# Patient Record
Sex: Male | Born: 1955
Health system: Southern US, Community
[De-identification: ages and names within clinical notes are randomized; demographics above are authoritative.]

## PROBLEM LIST (undated history)

## (undated) DIAGNOSIS — J439 Emphysema, unspecified: Secondary | ICD-10-CM

## (undated) DIAGNOSIS — J984 Other disorders of lung: Secondary | ICD-10-CM

## (undated) DIAGNOSIS — J479 Bronchiectasis, uncomplicated: Secondary | ICD-10-CM

## (undated) DIAGNOSIS — R0602 Shortness of breath: Secondary | ICD-10-CM

## (undated) DIAGNOSIS — E7800 Pure hypercholesterolemia, unspecified: Secondary | ICD-10-CM

## (undated) HISTORY — DX: Emphysema, unspecified: J43.9

## (undated) HISTORY — DX: Pure hypercholesterolemia, unspecified: E78.00

## (undated) HISTORY — DX: Other disorders of lung: J98.4

## (undated) HISTORY — DX: Bronchiectasis, uncomplicated: J47.9

## (undated) HISTORY — DX: Shortness of breath: R06.02

---

## 2019-05-22 DIAGNOSIS — R509 Fever, unspecified: Secondary | ICD-10-CM | POA: Diagnosis not present

## 2019-05-22 DIAGNOSIS — U071 COVID-19: Secondary | ICD-10-CM | POA: Diagnosis not present

## 2019-05-22 DIAGNOSIS — M5430 Sciatica, unspecified side: Secondary | ICD-10-CM | POA: Diagnosis not present

## 2019-05-25 DIAGNOSIS — Z9981 Dependence on supplemental oxygen: Secondary | ICD-10-CM | POA: Diagnosis not present

## 2019-05-25 DIAGNOSIS — R0602 Shortness of breath: Secondary | ICD-10-CM | POA: Diagnosis not present

## 2019-05-25 DIAGNOSIS — R6520 Severe sepsis without septic shock: Secondary | ICD-10-CM | POA: Diagnosis not present

## 2019-05-25 DIAGNOSIS — I252 Old myocardial infarction: Secondary | ICD-10-CM | POA: Diagnosis not present

## 2019-05-25 DIAGNOSIS — J1289 Other viral pneumonia: Secondary | ICD-10-CM | POA: Diagnosis not present

## 2019-05-25 DIAGNOSIS — A419 Sepsis, unspecified organism: Secondary | ICD-10-CM | POA: Diagnosis not present

## 2019-05-25 DIAGNOSIS — R000 Tachycardia, unspecified: Secondary | ICD-10-CM | POA: Diagnosis not present

## 2019-05-25 DIAGNOSIS — Z8673 Personal history of transient ischemic attack (TIA), and cerebral infarction without residual deficits: Secondary | ICD-10-CM | POA: Diagnosis not present

## 2019-05-25 DIAGNOSIS — T380X5A Adverse effect of glucocorticoids and synthetic analogues, initial encounter: Secondary | ICD-10-CM | POA: Diagnosis not present

## 2019-05-25 DIAGNOSIS — A4189 Other specified sepsis: Secondary | ICD-10-CM | POA: Diagnosis not present

## 2019-05-25 DIAGNOSIS — U071 COVID-19: Secondary | ICD-10-CM | POA: Diagnosis not present

## 2019-05-25 DIAGNOSIS — R739 Hyperglycemia, unspecified: Secondary | ICD-10-CM | POA: Diagnosis not present

## 2019-05-25 DIAGNOSIS — J9601 Acute respiratory failure with hypoxia: Secondary | ICD-10-CM | POA: Diagnosis not present

## 2019-06-10 DIAGNOSIS — U071 COVID-19: Secondary | ICD-10-CM | POA: Diagnosis not present

## 2019-06-30 DIAGNOSIS — U071 COVID-19: Secondary | ICD-10-CM | POA: Diagnosis not present

## 2019-07-24 DIAGNOSIS — M5441 Lumbago with sciatica, right side: Secondary | ICD-10-CM | POA: Diagnosis not present

## 2019-07-24 DIAGNOSIS — Z131 Encounter for screening for diabetes mellitus: Secondary | ICD-10-CM | POA: Diagnosis not present

## 2019-07-24 DIAGNOSIS — M5442 Lumbago with sciatica, left side: Secondary | ICD-10-CM | POA: Diagnosis not present

## 2019-07-24 DIAGNOSIS — Z03818 Encounter for observation for suspected exposure to other biological agents ruled out: Secondary | ICD-10-CM | POA: Diagnosis not present

## 2019-07-24 DIAGNOSIS — Z79899 Other long term (current) drug therapy: Secondary | ICD-10-CM | POA: Diagnosis not present

## 2019-07-24 DIAGNOSIS — R5383 Other fatigue: Secondary | ICD-10-CM | POA: Diagnosis not present

## 2019-07-24 DIAGNOSIS — E559 Vitamin D deficiency, unspecified: Secondary | ICD-10-CM | POA: Diagnosis not present

## 2019-07-24 DIAGNOSIS — Z125 Encounter for screening for malignant neoplasm of prostate: Secondary | ICD-10-CM | POA: Diagnosis not present

## 2019-07-24 DIAGNOSIS — Z0000 Encounter for general adult medical examination without abnormal findings: Secondary | ICD-10-CM | POA: Diagnosis not present

## 2019-07-24 DIAGNOSIS — R0602 Shortness of breath: Secondary | ICD-10-CM | POA: Diagnosis not present

## 2019-07-24 DIAGNOSIS — R072 Precordial pain: Secondary | ICD-10-CM | POA: Diagnosis not present

## 2019-07-24 DIAGNOSIS — G8929 Other chronic pain: Secondary | ICD-10-CM | POA: Diagnosis not present

## 2019-07-31 DIAGNOSIS — U071 COVID-19: Secondary | ICD-10-CM | POA: Diagnosis not present

## 2019-08-28 DIAGNOSIS — U071 COVID-19: Secondary | ICD-10-CM | POA: Diagnosis not present

## 2019-09-28 DIAGNOSIS — U071 COVID-19: Secondary | ICD-10-CM | POA: Diagnosis not present

## 2019-12-10 DIAGNOSIS — K219 Gastro-esophageal reflux disease without esophagitis: Secondary | ICD-10-CM | POA: Diagnosis not present

## 2019-12-10 DIAGNOSIS — I252 Old myocardial infarction: Secondary | ICD-10-CM | POA: Diagnosis not present

## 2019-12-10 DIAGNOSIS — M5432 Sciatica, left side: Secondary | ICD-10-CM | POA: Diagnosis not present

## 2019-12-10 DIAGNOSIS — Z7689 Persons encountering health services in other specified circumstances: Secondary | ICD-10-CM | POA: Diagnosis not present

## 2020-03-16 DIAGNOSIS — M545 Low back pain, unspecified: Secondary | ICD-10-CM | POA: Diagnosis not present

## 2020-03-16 DIAGNOSIS — M79604 Pain in right leg: Secondary | ICD-10-CM | POA: Diagnosis not present

## 2020-03-16 DIAGNOSIS — Z87891 Personal history of nicotine dependence: Secondary | ICD-10-CM | POA: Diagnosis not present

## 2020-03-23 DIAGNOSIS — M79604 Pain in right leg: Secondary | ICD-10-CM | POA: Diagnosis not present

## 2020-04-21 DIAGNOSIS — Z87891 Personal history of nicotine dependence: Secondary | ICD-10-CM | POA: Diagnosis not present

## 2020-04-21 DIAGNOSIS — R0602 Shortness of breath: Secondary | ICD-10-CM | POA: Diagnosis not present

## 2020-04-21 DIAGNOSIS — R062 Wheezing: Secondary | ICD-10-CM | POA: Diagnosis not present

## 2020-06-09 DIAGNOSIS — R062 Wheezing: Secondary | ICD-10-CM | POA: Diagnosis not present

## 2020-06-09 DIAGNOSIS — M545 Low back pain, unspecified: Secondary | ICD-10-CM | POA: Diagnosis not present

## 2020-06-23 DIAGNOSIS — Z20822 Contact with and (suspected) exposure to covid-19: Secondary | ICD-10-CM | POA: Diagnosis not present

## 2020-06-23 DIAGNOSIS — R0602 Shortness of breath: Secondary | ICD-10-CM | POA: Diagnosis not present

## 2020-06-23 DIAGNOSIS — R42 Dizziness and giddiness: Secondary | ICD-10-CM | POA: Diagnosis not present

## 2020-06-23 DIAGNOSIS — J101 Influenza due to other identified influenza virus with other respiratory manifestations: Secondary | ICD-10-CM | POA: Diagnosis not present

## 2020-06-26 DIAGNOSIS — I4510 Unspecified right bundle-branch block: Secondary | ICD-10-CM | POA: Diagnosis not present

## 2020-07-05 DIAGNOSIS — R000 Tachycardia, unspecified: Secondary | ICD-10-CM | POA: Diagnosis not present

## 2020-07-05 DIAGNOSIS — R0602 Shortness of breath: Secondary | ICD-10-CM | POA: Diagnosis not present

## 2020-07-05 DIAGNOSIS — R062 Wheezing: Secondary | ICD-10-CM | POA: Diagnosis not present

## 2020-07-05 DIAGNOSIS — R0603 Acute respiratory distress: Secondary | ICD-10-CM | POA: Diagnosis not present

## 2020-07-05 DIAGNOSIS — J479 Bronchiectasis, uncomplicated: Secondary | ICD-10-CM | POA: Diagnosis not present

## 2020-07-05 DIAGNOSIS — Z8616 Personal history of COVID-19: Secondary | ICD-10-CM | POA: Diagnosis not present

## 2020-07-05 DIAGNOSIS — Z20822 Contact with and (suspected) exposure to covid-19: Secondary | ICD-10-CM | POA: Diagnosis not present

## 2020-07-05 DIAGNOSIS — I4519 Other right bundle-branch block: Secondary | ICD-10-CM | POA: Diagnosis not present

## 2020-07-06 ENCOUNTER — Encounter: Payer: Self-pay | Admitting: Pulmonary Disease

## 2020-07-06 ENCOUNTER — Ambulatory Visit: Payer: BC Managed Care – PPO | Admitting: Pulmonary Disease

## 2020-07-06 ENCOUNTER — Other Ambulatory Visit: Payer: Self-pay

## 2020-07-06 VITALS — BP 130/70 | HR 123 | Temp 98.2°F | Ht 70.0 in | Wt 243.4 lb

## 2020-07-06 DIAGNOSIS — R000 Tachycardia, unspecified: Secondary | ICD-10-CM | POA: Diagnosis not present

## 2020-07-06 DIAGNOSIS — J432 Centrilobular emphysema: Secondary | ICD-10-CM

## 2020-07-06 DIAGNOSIS — J449 Chronic obstructive pulmonary disease, unspecified: Secondary | ICD-10-CM | POA: Diagnosis not present

## 2020-07-06 DIAGNOSIS — R9389 Abnormal findings on diagnostic imaging of other specified body structures: Secondary | ICD-10-CM

## 2020-07-06 DIAGNOSIS — J471 Bronchiectasis with (acute) exacerbation: Secondary | ICD-10-CM

## 2020-07-06 DIAGNOSIS — I4510 Unspecified right bundle-branch block: Secondary | ICD-10-CM | POA: Diagnosis not present

## 2020-07-06 MED ORDER — TRELEGY ELLIPTA 200-62.5-25 MCG/INH IN AEPB: 1.0000 | INHALATION_SPRAY | Freq: Every day | RESPIRATORY_TRACT | 2 refills | Status: DC

## 2020-07-06 MED ORDER — TRELEGY ELLIPTA 200-62.5-25 MCG/INH IN AEPB: 1.0000 | INHALATION_SPRAY | Freq: Every day | RESPIRATORY_TRACT | 0 refills | Status: DC

## 2020-07-06 MED ORDER — PREDNISONE 20 MG PO TABS: 20.0000 mg | ORAL_TABLET | Freq: Every day | ORAL | 0 refills | Status: DC

## 2020-07-06 MED ORDER — LEVOFLOXACIN 750 MG PO TABS: 750.0000 mg | ORAL_TABLET | Freq: Every day | ORAL | 0 refills | Status: AC

## 2020-07-06 NOTE — Patient Instructions (Signed)
Shortness of breath Emphysema Bronchiectasis  Called in a prescription for Levaquin Start you on Trelegy -We will give you a sample of Trelegy, prescription will be sent to pharmacy  Sent in a prescription for steroids to have on hand, 20 mg daily, you may start this to be used for 2 to 3 days if you feel you are not started as what happened on the day you went to the hospital  We will get a breathing study at some point when things are more steady to assess lung functions  I will see you in about 6 to 8 weeks  Call with significant concerns  If you can, currently obtain a hard copy of your CT scan to be made available so I can review it   Call with significant concerns

## 2020-07-06 NOTE — Progress Notes (Signed)
Neil Martin    [id number]    Oct 11, 1955  Primary Care Physician:Pcp, No  Referring Physician: No referring provider defined for this encounter.  Chief complaint:   Patient being seen for shortness of breath  HPI:  Shortness of breath has been progressive and led to evaluation in the emergency department yesterday with him not being able to breathe Signe Colt He did have a CT scan of the chest showing bronchiectasis, emphysema  He did have Covid December 2020 Felt he recovered to a good degree but still had some shortness of breath and cough  He will have spasms of coughing usually at night with amlodipine able to take in a breath at all Has been using albuterol as needed Was treated for a bronchitis about 6 weeks ago with a course of amoxicillin  Following discharge yesterday, he was given some steroids  Reformed smoker quit in 2014  History of hypercholesterolemia  Outpatient Encounter Medications as of 07/06/2020  Medication Sig  . albuterol (VENTOLIN HFA) 108 (90 Base) MCG/ACT inhaler Inhale into the lungs.  . benzonatate (TESSALON) 100 MG capsule Take 200 mg by mouth every 8 (eight) hours as needed.  . predniSONE (DELTASONE) 20 MG tablet Take by mouth.   No facility-administered encounter medications on file as of 07/06/2020.    Allergies as of 07/06/2020  . (Not on File)    No past medical history on file.  No family history on file.  Social History   Socioeconomic History  . Marital status: Married    Spouse name: Not on file  . Number of children: Not on file  . Years of education: Not on file  . Highest education level: Not on file  Occupational History  . Not on file  Tobacco Use  . Smoking status: Former Smoker    Quit date: 06/06/2012    Years since quitting: 8.0  . Smokeless tobacco: Never Used  Substance and Sexual Activity  . Alcohol use: Not on file  . Drug use: Not on file  . Sexual activity: Not on file  Other Topics Concern  .  Not on file  Social History Narrative  . Not on file   Social Determinants of Health   Financial Resource Strain: Not on file  Food Insecurity: Not on file  Transportation Needs: Not on file  Physical Activity: Not on file  Stress: Not on file  Social Connections: Not on file  Intimate Partner Violence: Not on file    Review of Systems  Constitutional: Positive for fatigue.  Respiratory: Positive for cough and shortness of breath.     Vitals:   07/06/20 1412  BP: 130/70  Pulse: (!) 123  Temp: 98.2 F (36.8 C)  SpO2: 95%     Physical Exam Constitutional:      Appearance: He is obese.  HENT:     Head: Normocephalic and atraumatic.     Mouth/Throat:     Mouth: Mucous membranes are moist.  Cardiovascular:     Rate and Rhythm: Normal rate and regular rhythm.     Heart sounds: No murmur heard. No friction rub.  Pulmonary:     Effort: No respiratory distress.     Breath sounds: No stridor. No wheezing or rhonchi.     Comments: Decreased air entry bilaterally Musculoskeletal:     Cervical back: No rigidity or tenderness.  Neurological:     Mental Status: He is alert.  Psychiatric:  Mood and Affect: Mood normal.   Data Reviewed: CT scan report was seen showing evidence of bronchiectasis, mucous plugging, emphysema  Assessment:  Obstructive lung disease  Exacerbation of bronchiectasis  Emphysema  Shortness of breath from above  Inhaler technique reviewed  Plan/Recommendations: Course of Levaquin 750 daily for 5 days Complete course course of steroids  Start on Trelegy  We will give him a course of prednisone to have on hand  Encouraged to obtain a copy-hard copy of the CT for me to review  Tentative follow-up in 6 to 8 weeks  Call with significant concerns   Virl Diamond MD [facility] 07/06/2020, 2:55 PM  CC: No ref. provider found

## 2020-07-17 ENCOUNTER — Institutional Professional Consult (permissible substitution): Payer: Self-pay | Admitting: Pulmonary Disease

## 2020-07-20 ENCOUNTER — Telehealth: Payer: Self-pay | Admitting: Pulmonary Disease

## 2020-07-20 DIAGNOSIS — R000 Tachycardia, unspecified: Secondary | ICD-10-CM

## 2020-07-20 NOTE — Telephone Encounter (Signed)
Okay with referral  Still encourage to seek urgent care if persistent symptoms

## 2020-07-20 NOTE — Telephone Encounter (Signed)
Called spoke with patient. He states there are time when he can feel his heart rate increase while doing minimal work.  He gets a little lightheaded, dizzy and cold and clammy. I told him he needs to seek emergent help if he starts to feel like this again.  I told him I couldn't predict when he would get to see cardiology. He said his daughter works in Teacher, music and has a cardiologist that could get him in next week. I told him we would need the name of that Cardiologist once we heard from Dr. Wynona Neat for the referral. He said he would get that and call back  Dr. Wynona Neat please advise on referral to cardiology

## 2020-07-20 NOTE — Telephone Encounter (Signed)
If patient calls back please ask name of cardiologist he would like referral for

## 2020-07-21 NOTE — Telephone Encounter (Signed)
Referral has been placed for cardiology per pts request.

## 2020-07-21 NOTE — Telephone Encounter (Signed)
Called and spoke with pt to see if he was able to get the name of cardiologist from his daughter and pt stated that he had not yet. Pt stated he will call us back with the name of the cardiologist. Once pt calls back, we can then place referral. Will await return call.

## 2020-07-21 NOTE — Telephone Encounter (Signed)
Pt would like to be referred to [facility] carddiology on [address].  No specific provider.

## 2020-07-26 NOTE — Progress Notes (Unsigned)
Cardiology Office Note:    Date:  07/30/2020   ID:  Fay Records, DOB 29-Jun-1955, MRN [id number]  PCP:  Aviva Kluver   [facility]  Cardiologist:  Meriam Sprague, MD  Advanced Practice Provider:  No care team member to display Electrophysiologist:  None    Referring MD: Tomma Lightning, MD     History of Present Illness:    Calob Baskette is a 65 y.o. male with a hx of demand ischemia after spider bite (clean cath several years later), prior CVA (1990), prolonged COVID syndrome, emphysema who was referred by Dr. Wynona Neat for further evaluation of palpitations.  The patient states that he has been having "racing heart rates" which he thinks may be related to his underlying COPD/emphysema. HR has been running in the 110 but can go up to the 160s with minimal activity. He has some associated chest discomfort (described as tightness) as well as diaphoresis and lightheadedness when his heart rate is this high (150s-160s) with activity. During these episodes, his oxygen levels are on the lower side at 90%. Normal oxygen saturation at rest is around 93%. Not on home O2. Symptoms resolve with rest usually within 5 minutes but can take up to 45 minutes to calm down.  No known personal history of heart disease. No significant family history of heart disease. No LE edema, orthopnea, PND. Quit smoking 8 years; smoked for 40 years.   Patient had a positive stress test that was obtained prior to hernia surgery as part of pre-operative clearance. Cath at that time was negative for any obstructive disease. Procedure performed in [place] 16 years ago.  Has labs monitored at work. States cholesterol has been high. Not currently on medication.  Past Medical History:  Diagnosis Date  . Bronchiectasis (HCC)   . Emphysema lung (HCC)   . Hypercholesterolemia   . Lung disease   . SOB (shortness of breath)     No past surgical history on file.  Current Medications: Current  Meds  Medication Sig  . albuterol (VENTOLIN HFA) 108 (90 Base) MCG/ACT inhaler Inhale into the lungs.  . benzonatate (TESSALON) 100 MG capsule Take 200 mg by mouth every 8 (eight) hours as needed.  . cyclobenzaprine (FLEXERIL) 10 MG tablet Take 5-10 mg by mouth as needed.  . Fluticasone-Umeclidin-Vilant (TRELEGY ELLIPTA) 200-62.5-25 MCG/INH AEPB Inhale 1 puff into the lungs daily.  Marland Kitchen HYDROcodone-acetaminophen (NORCO/VICODIN) 5-325 MG tablet Take 1 tablet by mouth 2 (two) times daily as needed.  . meloxicam (MOBIC) 15 MG tablet Take 15 mg by mouth as needed.  . metoprolol tartrate (LOPRESSOR) 100 MG tablet Take 1 tablet (100 mg total) by mouth once for 1 dose. Take 2 hours prior to your CT scan appointment.  Marland Kitchen omeprazole (PRILOSEC) 40 MG capsule Take 40 mg by mouth as needed.  . predniSONE (DELTASONE) 20 MG tablet Take 1 tablet (20 mg total) by mouth daily with breakfast. (Patient taking differently: Take 20 mg by mouth as needed (flare ups).)     Allergies:   Patient has no allergy information on record.   Social History   Socioeconomic History  . Marital status: Married    Spouse name: Not on file  . Number of children: Not on file  . Years of education: Not on file  . Highest education level: Not on file  Occupational History  . Not on file  Tobacco Use  . Smoking status: Former Smoker    Quit date: 06/06/2012  Years since quitting: 8.1  . Smokeless tobacco: Never Used  Substance and Sexual Activity  . Alcohol use: Not on file  . Drug use: Not on file  . Sexual activity: Not on file  Other Topics Concern  . Not on file  Social History Narrative  . Not on file   Social Determinants of Health   Financial Resource Strain: Not on file  Food Insecurity: Not on file  Transportation Needs: Not on file  Physical Activity: Not on file  Stress: Not on file  Social Connections: Not on file     Family History: The patient's family history is not on file.  ROS:   Please see  the history of present illness.    Review of Systems  Constitutional: Positive for malaise/fatigue. Negative for chills and fever.  HENT: Negative for hearing loss.   Eyes: Negative for blurred vision and redness.  Respiratory: Positive for shortness of breath.   Cardiovascular: Positive for chest pain and palpitations. Negative for orthopnea, claudication, leg swelling and PND.  Gastrointestinal: Negative for nausea and vomiting.  Genitourinary: Negative for dysuria and flank pain.  Musculoskeletal: Positive for joint pain. Negative for falls.  Neurological: Positive for dizziness. Negative for loss of consciousness.  Endo/Heme/Allergies: Negative for polydipsia.  Psychiatric/Behavioral: Negative for substance abuse.    EKGs/Labs/Other Studies Reviewed:    The following studies were reviewed today: CTA 07/05/20: FINDINGS:   Pulmonary arteries: No pulmonary emboli identified.   Aorta/great vessels: No aneurysm.   Thoracic inlet/central airways: Thyroid normal. Airway patent.   Mediastinum/hila/axilla: Similar mildly enlarged right hilar lymph node measuring 12 mm (series 7/262). Additional scattered subcentimeter mediastinal lymph nodes are similar to prior. Small hiatal hernia.   Heart: Normal heart size. No pericardial effusion.   Lungs/pleura: Mild centrilobular emphysema. Faint hazy ground glass airspace opacities, predominantly within the right upper lobe are improved from prior, and potentially represents sequela of prior Covid infection. Bilateral dependent atelectasis with regions of mucous plugging in the bilateral lower lobes. Bronchial wall thickening and bronchiectasis, lower lobe predominant. No pleural effusion or pneumothorax.   Upper abdomen: Fatty atrophy of the pancreas. Nonspecific bilateral perinephric stranding.   Chest wall/MSK: Within normal limits5.   EKG:  EKG is  ordered today.  The ekg ordered today demonstrates NSR, incomplete RBBB, poor r-wave  progression, superior right axis. HR 97  Recent Labs: No results found for requested labs within last 8760 hours.  Recent Lipid Panel No results found for: CHOL, TRIG, HDL, CHOLHDL, VLDL, LDLCALC, LDLDIRECT    Physical Exam:    VS:  BP 130/70   Pulse 97   Ht 5\' 10"  (1.778 m)   Wt 240 lb (108.9 kg)   SpO2 98%   BMI 34.44 kg/m     Wt Readings from Last 3 Encounters:  07/30/20 240 lb (108.9 kg)  07/06/20 243 lb 6.4 oz (110.4 kg)     GEN:  Well nourished, well developed in no acute distress HEENT: Normal NECK: No JVD; No carotid bruits CARDIAC: RRR, no murmurs, rubs, gallops RESPIRATORY:  Diminished throughout but no wheezing ABDOMEN: Soft, non-tender, non-distended MUSCULOSKELETAL:  No edema; No deformity  SKIN: Warm and dry NEUROLOGIC:  Alert and oriented x 3 PSYCHIATRIC:  Normal affect   ASSESSMENT:    1. Racing heart beat   2. Chest pain, unspecified type   3. Dyspnea on exertion   4. Pulmonary emphysema, unspecified emphysema type (HCC)   5. TIA (transient ischemic attack)    PLAN:  In order of problems listed above:  #Palpitations: #Chest Tightness #Diaphoresis: Patient reports episodes of heart racing with minimal activity like climbing the stairs or walking an incline where his HR can get up to 150-160s. During these episodes, he develops chest tightness, dyspnea, diaphoresis and lightheadedness prompting him to need to sit down and rest. Symptoms usually resolve within 5 minutes of resting but have lasted up to . No significant palpitations at rest and no chest discomfort/SOB with rest. Notably has emphysema and notes that during these episodes, his sats drop to 90% which he thinks may be contributing. Has not had cardiac work-up in 16 years. Prior cath in 2016 (for positive stress test obtained as part of pre-op eval) showed no significant obstructive disease. Given symptoms and risk factors, however, will pursue further cardiac work-up at this  time. -Coronary CTA -TTE -7day zio -Patient will send in lipid panel from work--likely needs statin -Management of emphysema per primary  #COPD/Emphysema: Prior heavy smoker. Not on oxygen. -Management per primary  #Possible History of TIA/CVA: Occurred in 1990. Unclear if true CVA/TIA at that time (had slurred speech and headache per report that resolved but no head imaging).  -Lipid panel as above--likely needs statin -Plan to start ASA  daily pending coronary CTA       Medication Adjustments/Labs and Tests Ordered: Current medicines are reviewed at length with the patient today.  Concerns regarding medicines are outlined above.  Orders Placed This Encounter  Procedures  . CT CORONARY MORPH W/CTA COR W/SCORE W/CA W/CM &/OR WO/CM  . CT CORONARY FRACTIONAL FLOW RESERVE DATA PREP  . CT CORONARY FRACTIONAL FLOW RESERVE FLUID ANALYSIS  . Basic metabolic panel  . LONG TERM MONITOR (3-14 DAYS)  . EKG 12-Lead  . ECHOCARDIOGRAM COMPLETE   Meds ordered this encounter  Medications  . metoprolol tartrate (LOPRESSOR) 100 MG tablet    Sig: Take 1 tablet (100 mg total) by mouth once for 1 dose. Take 2 hours prior to your CT scan appointment.    Dispense:  1 tablet    Refill:  0    Patient Instructions  Medication Instructions:  Your provider recommends that you continue on your current medications as directed. Please refer to the Current Medication list given to you today.   *If you need a refill on your cardiac medications before your next appointment, please call your pharmacy*  Lab Work: TODAY! BMET If you have labs (blood work) drawn today and your tests are completely normal, you will receive your results only by: Marland Kitchen MyChart Message (if you have MyChart) OR . A paper copy in the mail If you have any lab test that is abnormal or we need to change your treatment, we will call you to review the results.  Testing/Procedures: Dr. Shari Prows recommends you wear a 7 day ZIO  (heart monitor). This will be mailed to your home.  Your physician has requested that you have an echocardiogram. Echocardiography is a painless test that uses sound waves to create images of your heart. It provides your doctor with information about the size and shape of your heart and how well your heart's chambers and valves are working. This procedure takes approximately one hour. There are no restrictions for this procedure.  Dr. Shari Prows recommends you have a CORONARY CT. You will be called to arrange.   Follow-Up: At [facility], you and your health needs are our priority.  As part of our continuing mission to provide you with exceptional heart care, we  have created designated Provider Care Teams.  These Care Teams include your primary Cardiologist (physician) and Advanced Practice Providers (APPs -  Physician Assistants and Nurse Practitioners) who all work together to provide you with the care you need, when you need it. Your next appointment:   3 month(s) The format for your next appointment:   In Person Provider:   You may see Dr. Shari Prows or one of the following Advanced Practice Providers on your designated Care Team:    Tereso Newcomer, PA-C  [place], [place]    CARDIAC CT INFORMATION: Your cardiac CT will be scheduled at one of the below locations:   [facility] [address] [place], [place] [address] [phone]  OR  [facility] [address] [place], [place] [address] [phone]  If scheduled at [facility], please arrive at the [facility] main entrance (entrance A) of [facility] 30 minutes prior to test start time. Proceed to the [facility] Radiology Department (first floor) to check-in and test prep.  If scheduled at [facility], please arrive 15 mins early for check-in and test prep.  Please follow these instructions carefully:  Hold all erectile  dysfunction medications at least 3 days (72 hrs) prior to test.  On the Night Before the Test: . Be sure to Drink plenty of water. . Do not consume any caffeinated/decaffeinated beverages or chocolate 12 hours prior to your test. . Do not take any antihistamines 12 hours prior to your test.  On the Day of the Test: . Drink plenty of water until 1 hour prior to the test. . Do not eat any food 4 hours prior to the test. . You may take your regular medications prior to the test EXCEPT: o Take metoprolol (Lopressor) 100 mg two hours prior to test.  After the Test: . Drink plenty of water. . After receiving IV contrast, you may experience a mild flushed feeling. This is normal. . On occasion, you may experience a mild rash up to 24 hours after the test. This is not dangerous. If this occurs, you can take Benadryl 25 mg and increase your fluid intake. . If you experience trouble breathing, this can be serious. If it is severe call 911 IMMEDIATELY. If it is mild, please call our office.  Once we have confirmed authorization from your insurance company, we will call you to set up a date and time for your test. Based on how quickly your insurance processes prior authorizations requests, please allow up to 4 weeks to be contacted for scheduling your Cardiac CT appointment. Be advised that routine Cardiac CT appointments could be scheduled as many as 8 weeks after your provider has ordered it.  For non-scheduling related questions, please contact the cardiac imaging nurse navigator should you have any questions/concerns: Rockwell Alexandria, Cardiac Imaging Nurse Navigator Larey Brick, Cardiac Imaging Nurse Navigator [facility] Direct Office Dial: [phone]   For scheduling needs, including cancellations and rescheduling, please call [place], [phone].   ZIO XT- Long Term Monitor Instructions   Your physician has requested you wear your ZIO patch monitor 7 days.    This is a single patch monitor.  [employer] supplies one patch monitor per enrollment.  Additional stickers are not available.   Please do not apply patch if you will be having a Nuclear Stress Test, Echocardiogram, Cardiac CT, MRI, or Chest Xray during the time frame you would be wearing  the monitor. The patch cannot be worn during these tests.  You cannot remove and re-apply the ZIO XT patch monitor.   Your ZIO patch monitor will be sent USPS Priority mail from [facility] directly to your home address. The monitor may also be mailed to a PO BOX if home delivery is not available.   It may take 3-5 days to receive your monitor after you have been enrolled.   Once you have received you monitor, please review enclosed instructions.  Your monitor has already been registered assigning a specific monitor serial # to you.   Applying the monitor   Shave hair from upper left chest.   Hold abrader disc by orange tab.  Rub abrader in 40 strokes over left upper chest as indicated in your monitor instructions.   Clean area with 4 enclosed alcohol pads .  Use all pads to assure are is cleaned thoroughly.  Let dry.   Apply patch as indicated in monitor instructions.  Patch will be place under collarbone on left side of chest with arrow pointing upward.   Rub patch adhesive wings for 2 minutes.Remove white label marked "1".  Remove white label marked "2".  Rub patch adhesive wings for 2 additional minutes.   While looking in a mirror, press and release button in center of patch.  A small green light will flash 3-4 times .  This will be your only indicator the monitor has been turned on.     Do not shower for the first 24 hours.  You may shower after the first 24 hours.   Press button if you feel a symptom. You will hear a small click.  Record Date, Time and Symptom in the Patient Log Book.   When you are ready to remove patch, follow instructions on last 2 pages of Patient Log Book.  Stick  patch monitor onto last page of Patient Log Book.   Place Patient Log Book in [place] box.  Use locking tab on box and tape box closed securely.  The [employer] has [employer] on it.  Please place in mailbox as soon as possible.  Your physician should have your test results approximately 7 days after the monitor has been mailed back to [facility]   Call [facility] Customer Care at [phone] if you have questions regarding your ZIO XT patch monitor.  Call them immediately if you see an orange light blinking on your monitor.   If your monitor falls off in less than 4 days contact our Monitor department at [phone].  If your monitor becomes loose or falls off after 4 days call [employer] at [phone] for suggestions on securing your monitor.      Signed, Meriam Sprague, MD  07/30/2020 11:23 AM    [facility]

## 2020-07-30 ENCOUNTER — Encounter: Payer: Self-pay | Admitting: Radiology

## 2020-07-30 ENCOUNTER — Other Ambulatory Visit: Payer: Self-pay

## 2020-07-30 ENCOUNTER — Ambulatory Visit: Payer: BC Managed Care – PPO | Admitting: Cardiology

## 2020-07-30 ENCOUNTER — Encounter: Payer: Self-pay | Admitting: Cardiology

## 2020-07-30 ENCOUNTER — Ambulatory Visit (INDEPENDENT_AMBULATORY_CARE_PROVIDER_SITE_OTHER): Payer: BC Managed Care – PPO

## 2020-07-30 VITALS — BP 130/70 | HR 97 | Ht 70.0 in | Wt 240.0 lb

## 2020-07-30 DIAGNOSIS — R000 Tachycardia, unspecified: Secondary | ICD-10-CM

## 2020-07-30 DIAGNOSIS — J439 Emphysema, unspecified: Secondary | ICD-10-CM | POA: Diagnosis not present

## 2020-07-30 DIAGNOSIS — G459 Transient cerebral ischemic attack, unspecified: Secondary | ICD-10-CM

## 2020-07-30 DIAGNOSIS — R079 Chest pain, unspecified: Secondary | ICD-10-CM

## 2020-07-30 DIAGNOSIS — R0600 Dyspnea, unspecified: Secondary | ICD-10-CM

## 2020-07-30 DIAGNOSIS — R0609 Other forms of dyspnea: Secondary | ICD-10-CM

## 2020-07-30 LAB — BASIC METABOLIC PANEL
BUN/Creatinine Ratio: 19 (ref 10–24)
BUN: 21 mg/dL (ref 8–27)
CO2: 24 mmol/L (ref 20–29)
Calcium: 9.7 mg/dL (ref 8.6–10.2)
Chloride: 102 mmol/L (ref 96–106)
Creatinine, Ser: 1.1 mg/dL (ref 0.76–1.27)
GFR calc Af Amer: 82 mL/min/{1.73_m2} (ref >59)
GFR calc non Af Amer: 71 mL/min/{1.73_m2} (ref >59)
Glucose: 109 mg/dL — ABNORMAL HIGH (ref 65–99)
Potassium: 4.6 mmol/L (ref 3.5–5.2)
Sodium: 140 mmol/L (ref 134–144)

## 2020-07-30 MED ORDER — METOPROLOL TARTRATE 100 MG PO TABS: 100.0000 mg | ORAL_TABLET | Freq: Once | ORAL | 0 refills | Status: DC

## 2020-07-30 NOTE — Patient Instructions (Addendum)
Medication Instructions:  Your provider recommends that you continue on your current medications as directed. Please refer to the Current Medication list given to you today.   *If you need a refill on your cardiac medications before your next appointment, please call your pharmacy*  Lab Work: TODAY! BMET If you have labs (blood work) drawn today and your tests are completely normal, you will receive your results only by: Marland Kitchen MyChart Message (if you have MyChart) OR . A paper copy in the mail If you have any lab test that is abnormal or we need to change your treatment, we will call you to review the results.  Testing/Procedures: Dr. Shari Prows recommends you wear a 7 day ZIO (heart monitor). This will be mailed to your home.  Your physician has requested that you have an echocardiogram. Echocardiography is a painless test that uses sound waves to create images of your heart. It provides your doctor with information about the size and shape of your heart and how well your heart's chambers and valves are working. This procedure takes approximately one hour. There are no restrictions for this procedure.  Dr. Shari Prows recommends you have a CORONARY CT. You will be called to arrange.   Follow-Up: At [facility], you and your health needs are our priority.  As part of our continuing mission to provide you with exceptional heart care, we have created designated Provider Care Teams.  These Care Teams include your primary Cardiologist (physician) and Advanced Practice Providers (APPs -  Physician Assistants and Nurse Practitioners) who all work together to provide you with the care you need, when you need it. Your next appointment:   3 month(s) The format for your next appointment:   In Person Provider:   You may see Dr. Shari Prows or one of the following Advanced Practice Providers on your designated Care Team:    Tereso Newcomer, PA-C  [place], [place]    CARDIAC CT INFORMATION: Your cardiac  CT will be scheduled at one of the below locations:   [facility] [address] [place], [place] [address] [phone]  OR  [facility] [address] [place], [place] [address] [phone]  If scheduled at [facility], please arrive at the [facility] main entrance (entrance A) of [facility] 30 minutes prior to test start time. Proceed to the [facility] Radiology Department (first floor) to check-in and test prep.  If scheduled at [facility], please arrive 15 mins early for check-in and test prep.  Please follow these instructions carefully:  Hold all erectile dysfunction medications at least 3 days (72 hrs) prior to test.  On the Night Before the Test: . Be sure to Drink plenty of water. . Do not consume any caffeinated/decaffeinated beverages or chocolate 12 hours prior to your test. . Do not take any antihistamines 12 hours prior to your test.  On the Day of the Test: . Drink plenty of water until 1 hour prior to the test. . Do not eat any food 4 hours prior to the test. . You may take your regular medications prior to the test EXCEPT: o Take metoprolol (Lopressor) 100 mg two hours prior to test.  After the Test: . Drink plenty of water. . After receiving IV contrast, you may experience a mild flushed feeling. This is normal. . On occasion, you may experience a mild rash up to 24 hours after the test. This is not dangerous. If  this occurs, you can take Benadryl 25 mg and increase your fluid intake. . If you experience trouble breathing, this can be serious. If it is severe call 911 IMMEDIATELY. If it is mild, please call our office.  Once we have confirmed authorization from your insurance company, we will call you to set up a date and time for your test. Based on how quickly your insurance processes prior authorizations requests, please allow up to 4 weeks to be  contacted for scheduling your Cardiac CT appointment. Be advised that routine Cardiac CT appointments could be scheduled as many as 8 weeks after your provider has ordered it.  For non-scheduling related questions, please contact the cardiac imaging nurse navigator should you have any questions/concerns: Rockwell Alexandria, Cardiac Imaging Nurse Navigator Larey Brick, Cardiac Imaging Nurse Navigator [facility] Direct Office Dial: [phone]   For scheduling needs, including cancellations and rescheduling, please call [place], [phone].   ZIO XT- Long Term Monitor Instructions   Your physician has requested you wear your ZIO patch monitor 7 days.   This is a single patch monitor.  [employer] supplies one patch monitor per enrollment.  Additional stickers are not available.   Please do not apply patch if you will be having a Nuclear Stress Test, Echocardiogram, Cardiac CT, MRI, or Chest Xray during the time frame you would be wearing the monitor. The patch cannot be worn during these tests.  You cannot remove and re-apply the ZIO XT patch monitor.   Your ZIO patch monitor will be sent USPS Priority mail from [facility] directly to your home address. The monitor may also be mailed to a PO BOX if home delivery is not available.   It may take 3-5 days to receive your monitor after you have been enrolled.   Once you have received you monitor, please review enclosed instructions.  Your monitor has already been registered assigning a specific monitor serial # to you.   Applying the monitor   Shave hair from upper left chest.   Hold abrader disc by orange tab.  Rub abrader in 40 strokes over left upper chest as indicated in your monitor instructions.   Clean area with 4 enclosed alcohol pads .  Use all pads to assure are is cleaned thoroughly.  Let dry.   Apply patch as indicated in monitor instructions.  Patch will be place under collarbone on left side  of chest with arrow pointing upward.   Rub patch adhesive wings for 2 minutes.Remove white label marked "1".  Remove white label marked "2".  Rub patch adhesive wings for 2 additional minutes.   While looking in a mirror, press and release button in center of patch.  A small green light will flash 3-4 times .  This will be your only indicator the monitor has been turned on.     Do not shower for the first 24 hours.  You may shower after the first 24 hours.   Press button if you feel a symptom. You will hear a small click.  Record Date, Time and Symptom in the Patient Log Book.   When you are ready to remove patch, follow instructions on last 2 pages of Patient Log Book.  Stick patch monitor onto last page of Patient Log Book.   Place Patient Log Book in [place] box.  Use locking tab on box and tape box closed securely.  The [employer] and [employer] has [employer] on it.  Please place in  mailbox as soon as possible.  Your physician should have your test results approximately 7 days after the monitor has been mailed back to [facility].   Call [facility] Customer Care at [phone] if you have questions regarding your ZIO XT patch monitor.  Call them immediately if you see an orange light blinking on your monitor.   If your monitor falls off in less than 4 days contact our Monitor department at [phone].  If your monitor becomes loose or falls off after 4 days call [employer] at [phone] for suggestions on securing your monitor.

## 2020-07-30 NOTE — Progress Notes (Signed)
Enrolled patient for a 7 day Zio XT Monitor to be mailed to patients home.  

## 2020-07-31 NOTE — Telephone Encounter (Signed)
Found patient's CT disk in AO's folder.   AO, can you please advise after you have reviewed the disk? Thanks!

## 2020-08-03 DIAGNOSIS — R000 Tachycardia, unspecified: Secondary | ICD-10-CM

## 2020-08-03 DIAGNOSIS — R079 Chest pain, unspecified: Secondary | ICD-10-CM | POA: Diagnosis not present

## 2020-08-13 ENCOUNTER — Telehealth (HOSPITAL_COMMUNITY): Payer: Self-pay | Admitting: Emergency Medicine

## 2020-08-13 NOTE — Telephone Encounter (Signed)
Reaching out to patient to offer assistance regarding upcoming cardiac imaging study; pt verbalizes understanding of appt date/time, parking situation and where to check in, pre-test NPO status and medications ordered, and verified current allergies; name and call back number provided for further questions should they arise Neil Addis RN Navigator Cardiac Imaging [place] [facility] [phone] office [phone] cell   100mg metoprolol tartrate 2h PTA Neil Martin  

## 2020-08-17 ENCOUNTER — Other Ambulatory Visit: Payer: Self-pay

## 2020-08-17 ENCOUNTER — Ambulatory Visit (HOSPITAL_COMMUNITY)
Admission: RE | Admit: 2020-08-17 | Discharge: 2020-08-17 | Disposition: A | Discharge status: Home / Self Care | Payer: BC Managed Care – PPO | Source: Ambulatory Visit | Attending: Cardiology | Admitting: Cardiology

## 2020-08-17 ENCOUNTER — Ambulatory Visit: Payer: BC Managed Care – PPO | Admitting: Pulmonary Disease

## 2020-08-17 DIAGNOSIS — R079 Chest pain, unspecified: Secondary | ICD-10-CM | POA: Insufficient documentation

## 2020-08-17 DIAGNOSIS — I2510 Atherosclerotic heart disease of native coronary artery without angina pectoris: Secondary | ICD-10-CM | POA: Diagnosis not present

## 2020-08-17 DIAGNOSIS — Z006 Encounter for examination for normal comparison and control in clinical research program: Secondary | ICD-10-CM

## 2020-08-17 MED ORDER — NITROGLYCERIN 0.4 MG SL SUBL
SUBLINGUAL_TABLET | SUBLINGUAL | Status: AC
  Administered 2020-08-17: 0.8 mg via SUBLINGUAL
  Filled 2020-08-17: qty 2

## 2020-08-17 MED ORDER — METOPROLOL TARTRATE 5 MG/5ML IV SOLN
INTRAVENOUS | Status: AC
  Administered 2020-08-17: 5 mg via INTRAVENOUS
  Filled 2020-08-17: qty 20

## 2020-08-17 MED ORDER — METOPROLOL TARTRATE 5 MG/5ML IV SOLN
5.0000 mg | INTRAVENOUS | Status: AC | PRN
  Administered 2020-08-17 (×3): 5 mg via INTRAVENOUS

## 2020-08-17 MED ORDER — NITROGLYCERIN 0.4 MG SL SUBL: 0.8000 mg | SUBLINGUAL_TABLET | Freq: Once | SUBLINGUAL | Status: AC

## 2020-08-17 MED ORDER — IOHEXOL 350 MG/ML SOLN
80.0000 mL | Freq: Once | INTRAVENOUS | Status: AC | PRN
  Administered 2020-08-17: 80 mL via INTRAVENOUS

## 2020-08-17 MED ORDER — DILTIAZEM HCL 25 MG/5ML IV SOLN
INTRAVENOUS | Status: AC
  Filled 2020-08-17: qty 5

## 2020-08-17 NOTE — Research (Signed)
IDENTIFY Informed Consent                  Subject Name: Neil Martin   Subject met inclusion and exclusion criteria.  The informed consent form, study requirements and expectations were reviewed with the subject and questions and concerns were addressed prior to the signing of the consent form.  The subject verbalized understanding of the trial requirements.  The subject agreed to participate in the IDENTIFY trial and signed the informed consent at 16:00PM on 08/17/20.  The informed consent was obtained prior to performance of any protocol-specific procedures for the subject.  A copy of the signed informed consent was given to the subject and a copy was placed in the subject's medical record.   Meade Maw, Naval architect

## 2020-08-19 ENCOUNTER — Ambulatory Visit (HOSPITAL_COMMUNITY)
Admission: RE | Admit: 2020-08-19 | Discharge: 2020-08-19 | Disposition: A | Discharge status: Home / Self Care | Payer: BC Managed Care – PPO | Source: Ambulatory Visit | Attending: Cardiology | Admitting: Cardiology

## 2020-08-19 DIAGNOSIS — I2510 Atherosclerotic heart disease of native coronary artery without angina pectoris: Secondary | ICD-10-CM | POA: Diagnosis not present

## 2020-08-19 DIAGNOSIS — R079 Chest pain, unspecified: Secondary | ICD-10-CM | POA: Diagnosis not present

## 2020-08-21 ENCOUNTER — Other Ambulatory Visit: Payer: Self-pay | Admitting: Cardiology

## 2020-08-21 ENCOUNTER — Telehealth: Payer: Self-pay

## 2020-08-21 DIAGNOSIS — R079 Chest pain, unspecified: Secondary | ICD-10-CM

## 2020-08-21 MED ORDER — ROSUVASTATIN CALCIUM 20 MG PO TABS: 20.0000 mg | ORAL_TABLET | Freq: Every day | ORAL | 3 refills | Status: DC

## 2020-08-21 NOTE — Telephone Encounter (Signed)
Labs have been ordered and scheduled.

## 2020-08-21 NOTE — Progress Notes (Signed)
Called patient to discuss his coronary CTA results which showed a significant but non-flow limiting lesion in the LAD. Will pursue aggressive medical management with ASA and crestor 20mg  daily. If symptoms worsen, will plan for cath at that time. Patient understands and is amenable to the plan.  , MD

## 2020-08-21 NOTE — Telephone Encounter (Signed)
-----   Message from Meriam Sprague, MD sent at 08/21/2020  5:06 PM EDT ----- Called the patient to discuss his CT results and his needs to start ASA 81mg  daily and crestor 20mg  daily (sent to his pharmacy). Can we get him set up for repeat lipids in 6 weeks. Thank you!!

## 2020-08-25 ENCOUNTER — Ambulatory Visit (HOSPITAL_COMMUNITY): Payer: BC Managed Care – PPO | Attending: Cardiovascular Disease

## 2020-08-25 ENCOUNTER — Other Ambulatory Visit: Payer: Self-pay

## 2020-08-25 DIAGNOSIS — R079 Chest pain, unspecified: Secondary | ICD-10-CM | POA: Diagnosis not present

## 2020-08-25 DIAGNOSIS — R000 Tachycardia, unspecified: Secondary | ICD-10-CM | POA: Diagnosis not present

## 2020-08-25 LAB — ECHOCARDIOGRAM COMPLETE
Area-P 1/2: 6.02 cm2
S' Lateral: 2.8 cm

## 2020-09-01 ENCOUNTER — Other Ambulatory Visit: Payer: Self-pay

## 2020-09-01 ENCOUNTER — Encounter: Payer: Self-pay | Admitting: Pulmonary Disease

## 2020-09-01 ENCOUNTER — Ambulatory Visit: Payer: BC Managed Care – PPO | Admitting: Pulmonary Disease

## 2020-09-01 VITALS — BP 120/88 | HR 110 | Temp 98.2°F | Ht 70.0 in | Wt 245.4 lb

## 2020-09-01 DIAGNOSIS — J479 Bronchiectasis, uncomplicated: Secondary | ICD-10-CM | POA: Diagnosis not present

## 2020-09-01 DIAGNOSIS — J449 Chronic obstructive pulmonary disease, unspecified: Secondary | ICD-10-CM | POA: Diagnosis not present

## 2020-09-01 MED ORDER — TRELEGY ELLIPTA 200-62.5-25 MCG/INH IN AEPB: 1.0000 | INHALATION_SPRAY | Freq: Every day | RESPIRATORY_TRACT | 0 refills | Status: DC

## 2020-09-01 NOTE — Patient Instructions (Signed)
Continue with Trelegy  I will see you back in about 3 months  Call with any significant concerns

## 2020-09-01 NOTE — Progress Notes (Signed)
Neil Martin    [id number]    01-06-1956  Primary Care Physician:Pcp, No  Referring Physician: No referring provider defined for this encounter.  Chief complaint:   Patient being followed up for shortness of breath  HPI: Shortness of breath is a little bit better Has been using Trelegy  Has not had any recent exacerbation  Did follow-up with cardiology  CT showing bronchiectasis, some emphysema  Symptoms started post Covid He did have Covid December 2020 Felt he recovered to a good degree but still had some shortness of breath and cough  He will have spasms of coughing usually at night with amlodipine able to take in a breath at all Has been using albuterol as needed Was treated for a bronchitis about 6 weeks ago with a course of amoxicillin  Steroids did help previously  Reformed smoker quit in 2014  History of hypercholesterolemia  Outpatient Encounter Medications as of 09/01/2020  Medication Sig  . albuterol (VENTOLIN HFA) 108 (90 Base) MCG/ACT inhaler Inhale into the lungs.  . cyclobenzaprine (FLEXERIL) 10 MG tablet Take 5-10 mg by mouth as needed.  . Fluticasone-Umeclidin-Vilant (TRELEGY ELLIPTA) 200-62.5-25 MCG/INH AEPB Inhale 1 puff into the lungs daily.  . Fluticasone-Umeclidin-Vilant (TRELEGY ELLIPTA) 200-62.5-25 MCG/INH AEPB Inhale 1 puff into the lungs daily.  Marland Kitchen HYDROcodone-acetaminophen (NORCO/VICODIN) 5-325 MG tablet Take 1 tablet by mouth 2 (two) times daily as needed.  . meloxicam (MOBIC) 15 MG tablet Take 15 mg by mouth as needed.  Marland Kitchen omeprazole (PRILOSEC) 40 MG capsule Take 40 mg by mouth as needed.  . rosuvastatin (CRESTOR) 20 MG tablet Take 1 tablet (20 mg total) by mouth daily.  . [DISCONTINUED] benzonatate (TESSALON) 100 MG capsule Take 200 mg by mouth every 8 (eight) hours as needed.  . [DISCONTINUED] predniSONE (DELTASONE) 20 MG tablet Take 1 tablet (20 mg total) by mouth daily with breakfast. (Patient taking differently: Take 20 mg by  mouth as needed (flare ups).)  . [DISCONTINUED] metoprolol tartrate (LOPRESSOR) 100 MG tablet Take 1 tablet (100 mg total) by mouth once for 1 dose. Take 2 hours prior to your CT scan appointment.   No facility-administered encounter medications on file as of 09/01/2020.    Allergies as of 09/01/2020  . (No Known Allergies)    Past Medical History:  Diagnosis Date  . Bronchiectasis (HCC)   . Emphysema lung (HCC)   . Hypercholesterolemia   . Lung disease   . SOB (shortness of breath)     History reviewed. No pertinent family history.  Social History   Socioeconomic History  . Marital status: Married    Spouse name: Not on file  . Number of children: Not on file  . Years of education: Not on file  . Highest education level: Not on file  Occupational History  . Not on file  Tobacco Use  . Smoking status: Former Smoker    Quit date: 06/06/2012    Years since quitting: 8.2  . Smokeless tobacco: Never Used  Substance and Sexual Activity  . Alcohol use: Not on file  . Drug use: Not on file  . Sexual activity: Not on file  Other Topics Concern  . Not on file  Social History Narrative  . Not on file   Social Determinants of Health   Financial Resource Strain: Not on file  Food Insecurity: Not on file  Transportation Needs: Not on file  Physical Activity: Not on file  Stress: Not on file  Social Connections: Not on file  Intimate Partner Violence: Not on file    Review of Systems  Constitutional: Positive for fatigue.  Respiratory: Positive for cough and shortness of breath.     Vitals:   09/01/20 1453  BP: 120/88  Pulse: (!) 110  Temp: 98.2 F (36.8 C)  SpO2: 97%     Physical Exam Constitutional:      Appearance: He is obese.  HENT:     Head: Normocephalic and atraumatic.     Mouth/Throat:     Mouth: Mucous membranes are moist.  Eyes:     General: No scleral icterus.       Right eye: No discharge.        Left eye: No discharge.  Cardiovascular:      Rate and Rhythm: Normal rate and regular rhythm.     Heart sounds: No murmur heard. No friction rub.  Pulmonary:     Effort: No respiratory distress.     Breath sounds: No stridor. No wheezing or rhonchi.     Comments: Decreased air entry bilaterally Musculoskeletal:     Cervical back: No rigidity or tenderness.  Neurological:     Mental Status: He is alert.  Psychiatric:        Mood and Affect: Mood normal.   Data Reviewed: CT scan report was seen showing evidence of bronchiectasis, mucous plugging, emphysema Cardiac CT reviewed with the patient-bases of the lungs discussed  Assessment:  Obstructive lung disease -Symptoms appear to be improving  Exacerbation of bronchiectasis -Symptoms appear better -Occasional sputum production  Emphysema -Currently on Trelegy  Shortness of breath appears to be better  Plan/Recommendations: Continue Trelegy  Tentative follow-up in 3 months  Call with significant concerns   Neil Diamond MD [place] 09/01/2020, 3:46 PM  CC: No ref. provider found

## 2020-09-10 DIAGNOSIS — Z0000 Encounter for general adult medical examination without abnormal findings: Secondary | ICD-10-CM | POA: Diagnosis not present

## 2020-09-10 DIAGNOSIS — Z13220 Encounter for screening for lipoid disorders: Secondary | ICD-10-CM | POA: Diagnosis not present

## 2020-09-10 DIAGNOSIS — R5381 Other malaise: Secondary | ICD-10-CM | POA: Diagnosis not present

## 2020-09-10 DIAGNOSIS — Z23 Encounter for immunization: Secondary | ICD-10-CM | POA: Diagnosis not present

## 2020-09-10 DIAGNOSIS — J479 Bronchiectasis, uncomplicated: Secondary | ICD-10-CM | POA: Diagnosis not present

## 2020-09-10 DIAGNOSIS — E785 Hyperlipidemia, unspecified: Secondary | ICD-10-CM | POA: Diagnosis not present

## 2020-09-10 DIAGNOSIS — Z6834 Body mass index (BMI) 34.0-34.9, adult: Secondary | ICD-10-CM | POA: Diagnosis not present

## 2020-10-02 ENCOUNTER — Other Ambulatory Visit: Payer: BC Managed Care – PPO

## 2020-10-28 ENCOUNTER — Ambulatory Visit: Payer: BC Managed Care – PPO | Admitting: Cardiology

## 2020-11-24 ENCOUNTER — Telehealth: Payer: Self-pay

## 2020-11-24 DIAGNOSIS — Z006 Encounter for examination for normal comparison and control in clinical research program: Secondary | ICD-10-CM

## 2020-11-24 NOTE — Telephone Encounter (Signed)
Called patient for 90 day Identify phone call no answer, I left a voicemail stating the intent of the phone call and our call back number to be reached in our department. 

## 2020-12-22 ENCOUNTER — Other Ambulatory Visit: Payer: Self-pay

## 2020-12-23 MED ORDER — TRELEGY ELLIPTA 200-62.5-25 MCG/INH IN AEPB
1.0000 | INHALATION_SPRAY | Freq: Every day | RESPIRATORY_TRACT | 0 refills | Status: DC
Start: 2020-12-23 — End: 2020-12-23

## 2020-12-23 MED ORDER — TRELEGY ELLIPTA 200-62.5-25 MCG/INH IN AEPB: 1.0000 | INHALATION_SPRAY | Freq: Every day | RESPIRATORY_TRACT | 1 refills | Status: DC

## 2020-12-23 NOTE — Addendum Note (Signed)
Addended by: Delrae Rend on: 12/23/2020 12:07 PM   Modules accepted: Orders

## 2020-12-30 ENCOUNTER — Telehealth: Payer: Self-pay | Admitting: Pulmonary Disease

## 2020-12-30 MED ORDER — TRELEGY ELLIPTA 200-62.5-25 MCG/INH IN AEPB: 1.0000 | INHALATION_SPRAY | Freq: Every day | RESPIRATORY_TRACT | 1 refills | Status: DC

## 2020-12-30 NOTE — Telephone Encounter (Signed)
Called and spoke with pt about the Trelegy 200 Rx. Stated to pt that we had sent the Rx over to pharmacy as a 90-day supply on 12/23/20. Pt said he had called the pharmacy and they are only showing the Rx as a 30-day supply and not as a 90-day supply.  Stated to pt that we would get this taken care of for him. Stated to pt that there are still samples of Trelegy up front at our office that he could come by to pick up and he verbalized understanding.  Called pt's pharmacy and spoke with Gracie about pt's Rx. She said that the only Rx they are seeing was Rx back from 1/22 which was sent as a 30 day supply with 2 refills. Stated to her that we sent Rx back on 12/23/20 as a 90-day supply with 1 refill but she said that she is not able to see that.  While I had Gracie on the phone, I resubmitted the Rx electronically to [facility] as a 90-day supply and Gracie did receive that Rx. Called and spoke with pt letting him know that I had gotten all taken care of and he verbalized understanding. Nothing further needed.

## 2021-06-14 ENCOUNTER — Other Ambulatory Visit: Payer: Self-pay

## 2021-06-14 ENCOUNTER — Ambulatory Visit (INDEPENDENT_AMBULATORY_CARE_PROVIDER_SITE_OTHER): Payer: BC Managed Care – PPO

## 2021-06-14 ENCOUNTER — Ambulatory Visit: Payer: BC Managed Care – PPO | Admitting: Pulmonary Disease

## 2021-06-14 ENCOUNTER — Encounter: Payer: Self-pay | Admitting: Pulmonary Disease

## 2021-06-14 VITALS — BP 126/84 | HR 109 | Temp 98.0°F | Ht 70.0 in | Wt 251.0 lb

## 2021-06-14 DIAGNOSIS — R059 Cough, unspecified: Secondary | ICD-10-CM | POA: Diagnosis not present

## 2021-06-14 DIAGNOSIS — J471 Bronchiectasis with (acute) exacerbation: Secondary | ICD-10-CM

## 2021-06-14 MED ORDER — PREDNISONE 10 MG PO TABS: 20.0000 mg | ORAL_TABLET | Freq: Every day | ORAL | 0 refills | Status: AC

## 2021-06-14 MED ORDER — DOXYCYCLINE HYCLATE 100 MG PO TABS: 100.0000 mg | ORAL_TABLET | Freq: Two times a day (BID) | ORAL | 0 refills | Status: DC

## 2021-06-14 NOTE — Addendum Note (Signed)
Addended by: Arvilla Market on: 06/14/2021 01:58 PM   Modules accepted: Orders

## 2021-06-14 NOTE — Progress Notes (Signed)
Neil Martin    [id number]    10/11/1955  Primary Care Physician:Pcp, No  Referring Physician: No referring provider defined for this encounter.  Chief complaint:    An acute visit for shortness of breath, cough  HPI:  Was doing well prior to the last few days, cough, sputum production, shortness of breath with activity  Has not been able to rest well at night, having to use NyQuil   Continues to use Trelegy  Has not had any recent exacerbation  Did follow-up with cardiology  CT showing bronchiectasis, some emphysema  Symptoms started post Covid He did have Covid December 2020 Felt he recovered to a good degree but still had some shortness of breath and cough  He will have spasms of coughing usually at night with amlodipine able to take in a breath at all Has been using albuterol as needed Was treated for a bronchitis about 6 weeks ago with a course of amoxicillin  Steroids did help previously  Reformed smoker quit in 2014  History of hypercholesterolemia  Outpatient Encounter Medications as of 06/14/2021  Medication Sig   albuterol (VENTOLIN HFA) 108 (90 Base) MCG/ACT inhaler Inhale into the lungs.   cyclobenzaprine (FLEXERIL) 10 MG tablet Take 5-10 mg by mouth as needed.   Fluticasone-Umeclidin-Vilant (TRELEGY ELLIPTA) 200-62.5-25 MCG/INH AEPB Inhale 1 puff into the lungs daily.   HYDROcodone-acetaminophen (NORCO/VICODIN) 5-325 MG tablet Take 1 tablet by mouth 2 (two) times daily as needed.   meloxicam (MOBIC) 15 MG tablet Take 15 mg by mouth as needed.   omeprazole (PRILOSEC) 40 MG capsule Take 40 mg by mouth as needed.   [DISCONTINUED] rosuvastatin (CRESTOR) 20 MG tablet Take 1 tablet (20 mg total) by mouth daily.   No facility-administered encounter medications on file as of 06/14/2021.    Allergies as of 06/14/2021   (No Known Allergies)    Past Medical History:  Diagnosis Date   Bronchiectasis (HCC)    Emphysema lung (HCC)     Hypercholesterolemia    Lung disease    SOB (shortness of breath)     No family history on file.  Social History   Socioeconomic History   Marital status: Married    Spouse name: Not on file   Number of children: Not on file   Years of education: Not on file   Highest education level: Not on file  Occupational History   Not on file  Tobacco Use   Smoking status: Former    Types: Cigarettes    Quit date: 06/06/2012    Years since quitting: 9.0   Smokeless tobacco: Never  Vaping Use   Vaping Use: Never used  Substance and Sexual Activity   Alcohol use: Not on file   Drug use: Not on file   Sexual activity: Not on file  Other Topics Concern   Not on file  Social History Narrative   Not on file   Social Determinants of Health   Financial Resource Strain: Not on file  Food Insecurity: Not on file  Transportation Needs: Not on file  Physical Activity: Not on file  Stress: Not on file  Social Connections: Not on file  Intimate Partner Violence: Not on file    Review of Systems  Constitutional:  Positive for fatigue.  Respiratory:  Positive for cough and shortness of breath.    Vitals:   06/14/21 1339  BP: 126/84  Pulse: (!) 109  Temp: 98 F (36.7 C)  SpO2: 95%     Physical Exam Constitutional:      Appearance: He is obese.  HENT:     Head: Normocephalic and atraumatic.     Mouth/Throat:     Mouth: Mucous membranes are moist.  Eyes:     General: No scleral icterus.       Right eye: No discharge.        Left eye: No discharge.  Cardiovascular:     Rate and Rhythm: Normal rate and regular rhythm.     Heart sounds: No murmur heard.   No friction rub.  Pulmonary:     Effort: No respiratory distress.     Breath sounds: No stridor. No wheezing or rhonchi.     Comments: Decreased air entry bilaterally Musculoskeletal:     Cervical back: No rigidity or tenderness.  Neurological:     Mental Status: He is alert.  Psychiatric:        Mood and Affect: Mood  normal.  Data Reviewed: CT scan report was seen showing evidence of bronchiectasis, mucous plugging, emphysema Cardiac CT reviewed with the patient-bases of the lungs discussed  Assessment:  Obstructive lung disease Acute exacerbation of COPD -Symptoms were stable prior to recent exacerbation  Exacerbation of bronchiectasis -Symptoms appear better -Regular sputum production  Emphysema -Currently on Trelegy  Shortness of breath overall better but exacerbated with current exacerbation  Plan/Recommendations: Continue Trelegy  Short course of steroids  Doxycycline 100 p.o. twice daily for 10 days  Tentative follow-up in 3 months  Call with significant concerns Call us to update [place] with how you are doing a couple of weeks   Virl Diamond MD [place] [facility] 06/14/2021, 1:43 PM  CC: No ref. provider found

## 2021-06-14 NOTE — Patient Instructions (Signed)
Chest x-ray today  Prednisone 20 mg daily for 10 days Doxycycline 100 mg p.o. twice daily for 10 days  Call us in 4 to 5 days if not feeling better-we may want to get a sputum for cultures  Call us in a couple of weeks to let us now how you are doing   Follow-up in 3 months

## 2021-06-19 ENCOUNTER — Encounter: Payer: Self-pay | Admitting: Pulmonary Disease

## 2021-06-19 DIAGNOSIS — J471 Bronchiectasis with (acute) exacerbation: Secondary | ICD-10-CM

## 2021-06-22 ENCOUNTER — Other Ambulatory Visit: Payer: Self-pay | Admitting: Pulmonary Disease

## 2021-06-22 MED ORDER — PROMETHAZINE-CODEINE 6.25-10 MG/5ML PO SYRP: 5.0000 mL | ORAL_SOLUTION | ORAL | 0 refills | Status: AC | PRN

## 2021-06-22 MED ORDER — PROMETHAZINE-CODEINE 6.25-10 MG/5ML PO SYRP: 5.0000 mL | ORAL_SOLUTION | ORAL | 0 refills | Status: DC | PRN

## 2021-06-22 NOTE — Telephone Encounter (Signed)
Cough syrup prescription sent to [facility] -Hopefully this helps  We should consider sputum for gram stain and cultures if you are not feeling better-this may help guide a change in antibiotics

## 2021-06-22 NOTE — Telephone Encounter (Signed)
Dr. Val Eagle, please see mychart messages from pt and advise.

## 2021-06-24 NOTE — Telephone Encounter (Signed)
Received the following message from patient:   "Thank you for responding and for calling in cough suppressant. [facility] did not have what you prescribed and obviously did not inform anyone of this. [facility] you [facility] into the [facility] [address]. Been meaning to change Pharmacy's anyways. As to Thanks again"  AO, please advise if you are ok with sending the cough syrup to a different pharmacy. Once this has been done, I will call the other pharmacy to cancel the RX that was sent in yesterday.

## 2021-06-28 MED ORDER — HYDROCODONE BIT-HOMATROP MBR 5-1.5 MG/5ML PO SOLN: 5.0000 mL | Freq: Four times a day (QID) | ORAL | 0 refills | Status: AC | PRN

## 2021-06-28 NOTE — Telephone Encounter (Signed)
We can request for one -Sputum gram stain and cultures

## 2021-06-28 NOTE — Telephone Encounter (Signed)
I have placed the orders per the provider and he is aware to come and pick up the cups. I have let him know how to collect and he is to call with any question.  He voices understanding.g Nothing further needed.

## 2021-06-28 NOTE — Progress Notes (Signed)
Opened in error

## 2021-06-28 NOTE — Telephone Encounter (Signed)
The patient wants to know about a sputum sample. Please advise.

## 2021-06-30 ENCOUNTER — Other Ambulatory Visit: Payer: BC Managed Care – PPO

## 2021-06-30 DIAGNOSIS — J471 Bronchiectasis with (acute) exacerbation: Secondary | ICD-10-CM | POA: Diagnosis not present

## 2021-07-03 LAB — RESPIRATORY CULTURE OR RESPIRATORY AND SPUTUM CULTURE
MICRO NUMBER:: 12917826
RESULT:: NORMAL
SPECIMEN QUALITY:: ADEQUATE

## 2021-07-05 ENCOUNTER — Other Ambulatory Visit: Payer: Self-pay | Admitting: Pulmonary Disease

## 2021-07-05 ENCOUNTER — Telehealth: Payer: Self-pay | Admitting: Pulmonary Disease

## 2021-07-05 ENCOUNTER — Encounter: Payer: Self-pay | Admitting: Pulmonary Disease

## 2021-07-05 MED ORDER — AMOXICILLIN-POT CLAVULANATE 875-125 MG PO TABS: 1.0000 | ORAL_TABLET | Freq: Two times a day (BID) | ORAL | 0 refills | Status: DC

## 2021-07-05 MED ORDER — AMOXICILLIN-POT CLAVULANATE 875-125 MG PO TABS: 1.0000 | ORAL_TABLET | Freq: Two times a day (BID) | ORAL | 0 refills | Status: AC

## 2021-07-05 NOTE — Telephone Encounter (Signed)
Dr. Jenetta Downer, please see mychart message sent by pt and advise.

## 2021-07-05 NOTE — Progress Notes (Signed)
Prescription for Augmentin called to pharmacy

## 2021-07-05 NOTE — Telephone Encounter (Signed)
Spoke to patient, who is requesting sputum culture results.  C/o prod cough with yellow sputum, wheezing and sob with exertion. Sx worsen at night and is causing him not to sleep well.   Denied f/c/s or additional sx.   He is using Hydromet QHS, Trelegy once daily and ventolin once daily.   Dr. Wynona Neat, please advise. Thanks

## 2021-07-05 NOTE — Telephone Encounter (Signed)
Patient is aware of recommendations and voiced his understanding.  [facility] Ander Slade [facility]  He is call back if sx do not improve.  Nothing further needed at this time.

## 2021-07-05 NOTE — Telephone Encounter (Signed)
Sputum with many epithelial cells and multiple organisms  -Not the best sample -With what it shows currently -Augmentin will be the most appropriate  -I will call in a prescription for Augmentin  -If the use of Augmentin does not help with resolution of symptoms -Then a bronchoscopy will need to be considered -Taking a look in the airway to get samples

## 2021-07-14 NOTE — Telephone Encounter (Signed)
Messages originally sent to Dr. Jenetta Downer. With Dr. Jenetta Downer now being out, sending to provider of the day.  Dr. Valeta Harms, please see recent mychart messages sent by pt or significant other and advise.

## 2021-07-16 MED ORDER — ONDANSETRON HCL 4 MG PO TABS: 4.0000 mg | ORAL_TABLET | Freq: Three times a day (TID) | ORAL | 0 refills | Status: AC | PRN

## 2021-07-16 NOTE — Telephone Encounter (Signed)
Icard, Octavio Graves, DO  You; [facility] Triage Pool 2 days ago   Sure. Zofran 4mg  Q8H PRN. Dispense #30  But he needs to take the antibiotic with food  Thanks   [place], DO  [facility]  07/14/2021 6:04 PM   Apparently Rx had not been sent in yet for pt. I have sent it to pharmacy for pt.

## 2021-07-16 NOTE — Telephone Encounter (Signed)
Dr. Valeta Harms, please see mychart messages sent by pt.

## 2021-07-24 DIAGNOSIS — S61203A Unspecified open wound of left middle finger without damage to nail, initial encounter: Secondary | ICD-10-CM | POA: Diagnosis not present

## 2021-07-24 DIAGNOSIS — Z23 Encounter for immunization: Secondary | ICD-10-CM | POA: Diagnosis not present

## 2021-08-09 DIAGNOSIS — J438 Other emphysema: Secondary | ICD-10-CM | POA: Diagnosis not present

## 2021-08-09 DIAGNOSIS — J479 Bronchiectasis, uncomplicated: Secondary | ICD-10-CM | POA: Diagnosis not present

## 2021-08-09 DIAGNOSIS — R0602 Shortness of breath: Secondary | ICD-10-CM | POA: Diagnosis not present

## 2021-08-09 DIAGNOSIS — U099 Post covid-19 condition, unspecified: Secondary | ICD-10-CM | POA: Diagnosis not present

## 2021-08-16 ENCOUNTER — Other Ambulatory Visit: Payer: Self-pay | Admitting: Cardiology

## 2021-08-16 DIAGNOSIS — R079 Chest pain, unspecified: Secondary | ICD-10-CM

## 2021-08-23 DIAGNOSIS — R053 Chronic cough: Secondary | ICD-10-CM | POA: Diagnosis not present

## 2021-08-23 DIAGNOSIS — R0602 Shortness of breath: Secondary | ICD-10-CM | POA: Diagnosis not present

## 2021-08-23 DIAGNOSIS — R9389 Abnormal findings on diagnostic imaging of other specified body structures: Secondary | ICD-10-CM | POA: Diagnosis not present

## 2021-08-23 DIAGNOSIS — J439 Emphysema, unspecified: Secondary | ICD-10-CM | POA: Diagnosis not present

## 2021-08-23 DIAGNOSIS — J479 Bronchiectasis, uncomplicated: Secondary | ICD-10-CM | POA: Diagnosis not present

## 2021-09-06 DIAGNOSIS — J432 Centrilobular emphysema: Secondary | ICD-10-CM | POA: Diagnosis not present

## 2021-09-11 DIAGNOSIS — J471 Bronchiectasis with (acute) exacerbation: Secondary | ICD-10-CM | POA: Diagnosis not present

## 2021-09-21 DIAGNOSIS — K219 Gastro-esophageal reflux disease without esophagitis: Secondary | ICD-10-CM | POA: Diagnosis not present

## 2021-09-21 DIAGNOSIS — E669 Obesity, unspecified: Secondary | ICD-10-CM | POA: Diagnosis not present

## 2021-09-21 DIAGNOSIS — Z6836 Body mass index (BMI) 36.0-36.9, adult: Secondary | ICD-10-CM | POA: Diagnosis not present

## 2021-09-21 DIAGNOSIS — J479 Bronchiectasis, uncomplicated: Secondary | ICD-10-CM | POA: Diagnosis not present

## 2021-09-21 DIAGNOSIS — Z87891 Personal history of nicotine dependence: Secondary | ICD-10-CM | POA: Diagnosis not present

## 2021-10-02 DIAGNOSIS — S62639A Displaced fracture of distal phalanx of unspecified finger, initial encounter for closed fracture: Secondary | ICD-10-CM | POA: Diagnosis not present

## 2021-10-02 DIAGNOSIS — S62634A Displaced fracture of distal phalanx of right ring finger, initial encounter for closed fracture: Secondary | ICD-10-CM | POA: Diagnosis not present

## 2021-10-02 DIAGNOSIS — S92424A Nondisplaced fracture of distal phalanx of right great toe, initial encounter for closed fracture: Secondary | ICD-10-CM | POA: Diagnosis not present

## 2021-10-02 DIAGNOSIS — S93401A Sprain of unspecified ligament of right ankle, initial encounter: Secondary | ICD-10-CM | POA: Diagnosis not present

## 2021-10-02 DIAGNOSIS — S93511A Sprain of interphalangeal joint of right great toe, initial encounter: Secondary | ICD-10-CM | POA: Diagnosis not present

## 2021-10-04 DIAGNOSIS — S92154A Nondisplaced avulsion fracture (chip fracture) of right talus, initial encounter for closed fracture: Secondary | ICD-10-CM | POA: Diagnosis not present

## 2021-10-04 DIAGNOSIS — S62634A Displaced fracture of distal phalanx of right ring finger, initial encounter for closed fracture: Secondary | ICD-10-CM | POA: Diagnosis not present

## 2021-10-05 ENCOUNTER — Other Ambulatory Visit: Payer: Self-pay | Admitting: Pulmonary Disease

## 2021-11-12 DIAGNOSIS — K219 Gastro-esophageal reflux disease without esophagitis: Secondary | ICD-10-CM | POA: Diagnosis not present

## 2021-11-12 DIAGNOSIS — J479 Bronchiectasis, uncomplicated: Secondary | ICD-10-CM | POA: Diagnosis not present

## 2021-11-12 DIAGNOSIS — J449 Chronic obstructive pulmonary disease, unspecified: Secondary | ICD-10-CM | POA: Diagnosis not present

## 2021-11-12 DIAGNOSIS — E669 Obesity, unspecified: Secondary | ICD-10-CM | POA: Diagnosis not present

## 2021-11-15 DIAGNOSIS — S62634D Displaced fracture of distal phalanx of right ring finger, subsequent encounter for fracture with routine healing: Secondary | ICD-10-CM | POA: Diagnosis not present

## 2021-11-15 DIAGNOSIS — S92154D Nondisplaced avulsion fracture (chip fracture) of right talus, subsequent encounter for fracture with routine healing: Secondary | ICD-10-CM | POA: Diagnosis not present

## 2021-12-08 DIAGNOSIS — S62634D Displaced fracture of distal phalanx of right ring finger, subsequent encounter for fracture with routine healing: Secondary | ICD-10-CM | POA: Diagnosis not present

## 2021-12-08 DIAGNOSIS — M79644 Pain in right finger(s): Secondary | ICD-10-CM | POA: Diagnosis not present

## 2021-12-25 IMAGING — CT CT HEART MORP W/ CTA COR W/ SCORE W/ CA W/CM &/OR W/O CM
4 of 7 series · 8 of 20 positions shown, 9 images · IV contrast (omnipaque)
Comparison: None.
COMPARISON: None.

Addendum:
EXAM:
OVER-READ INTERPRETATION  CT CHEST

The following report is an over-read performed by radiologist Dr.
Ferienhaus Erxleben [REDACTED] on 08/17/2020. This over-read
does not include interpretation of cardiac or coronary anatomy or
pathology. The coronary CTA interpretation by the cardiologist is
attached.
HISTORY: Chest pain, nonspecific
Cardiac/Coronary  CT
TECHNIQUE: The patient was scanned on a Siemens Force scanner.
PROTOCOL: A 120 kV prospective scan was triggered in the descending thoracic
aorta at 111 HU's. Axial non-contrast 3 mm slices were carried out
through the heart. The data set was analyzed on a dedicated work
station and scored using the Agatston method. Gantry rotation speed
was 250 msecs and collimation was .6 mm. Beta blockade and 0.8 mg of
sl NTG was given. The 3D data set was reconstructed in 5% intervals
of the 35-75 % of the R-R cycle. Systolic and diastolic phases were
analyzed on a dedicated work station using MPR, MIP and VRT modes.
The patient received 80mL OMNIPAQUE IOHEXOL 350 MG/ML SOLN contrast.

[Series 6: best diast 69 % · axial · 0.45mm/px · z∈[+1268,+1302]mm · 2 of 263 slices shown]
[im 88/263  vessel]
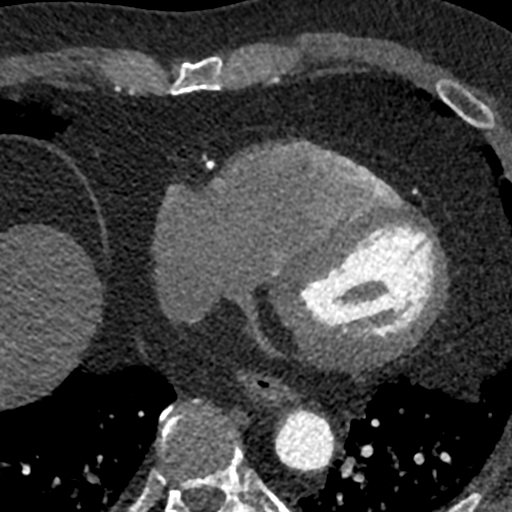
[im 175/263  vessel]
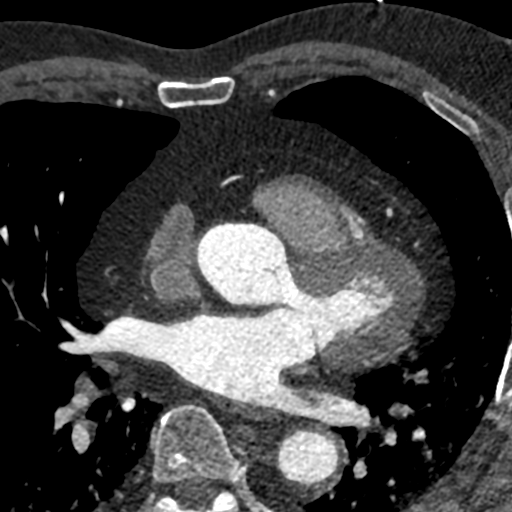

[Series 7: best syst · axial · 0.45mm/px · z∈[+1268,+1302]mm · 2 of 263 slices shown, 3 images]
[im 88/263  vessel]
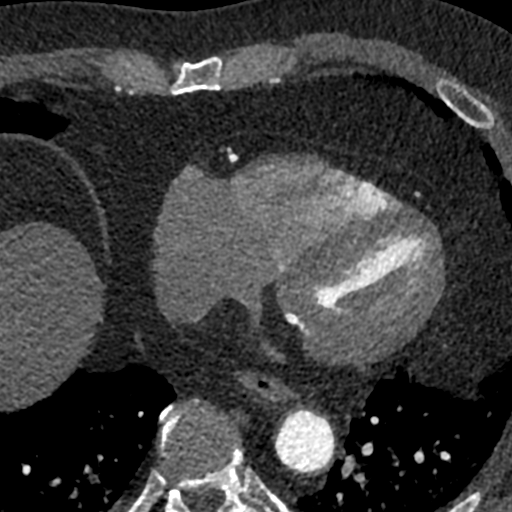
[im 88/263  lung]
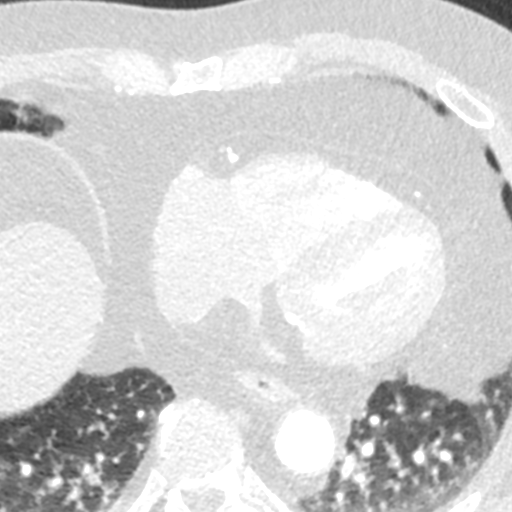
[im 175/263  vessel]
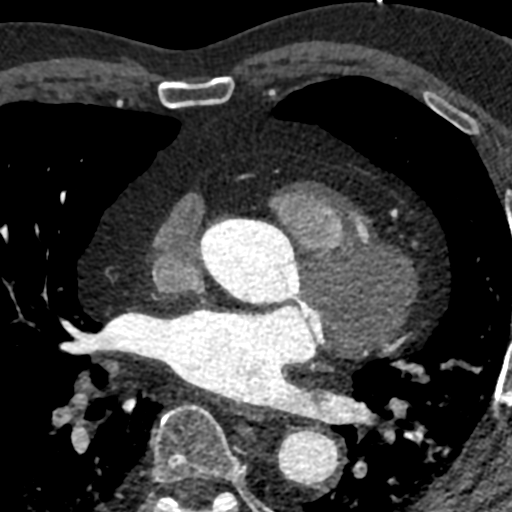

[Series 8: ts diast sharp 69 % · axial · 0.45mm/px · z∈[+1268,+1302]mm · 2 of 263 slices shown]
[im 88/263  lung]
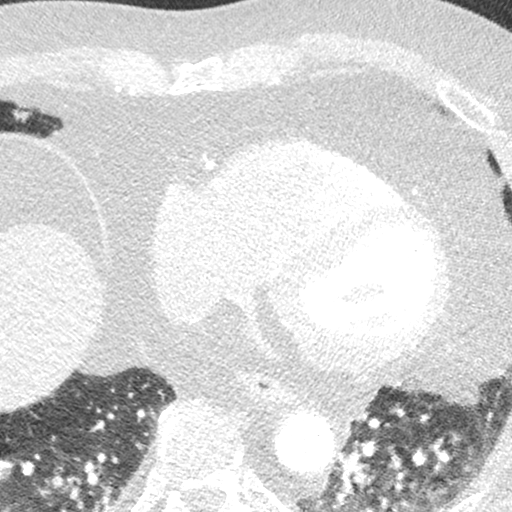
[im 175/263  lung]
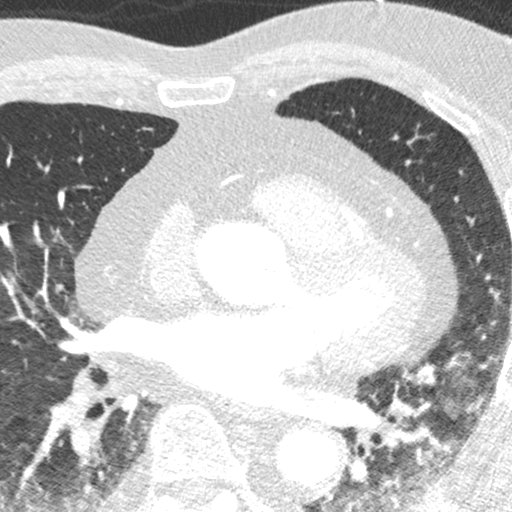

[Series 9: ts syst sharp · axial · 0.45mm/px · z∈[+1268,+1302]mm · 2 of 263 slices shown]
[im 88/263  lung]
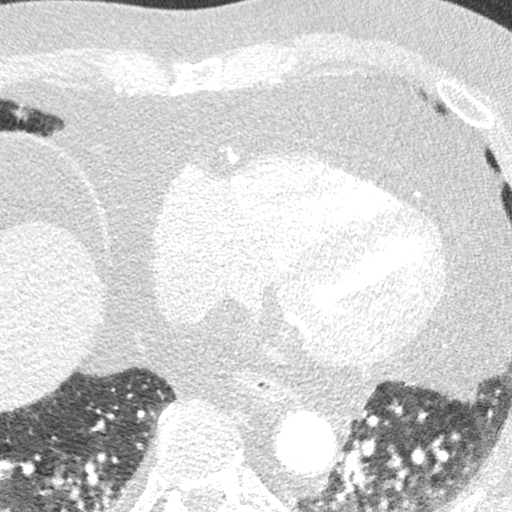
[im 175/263  lung]
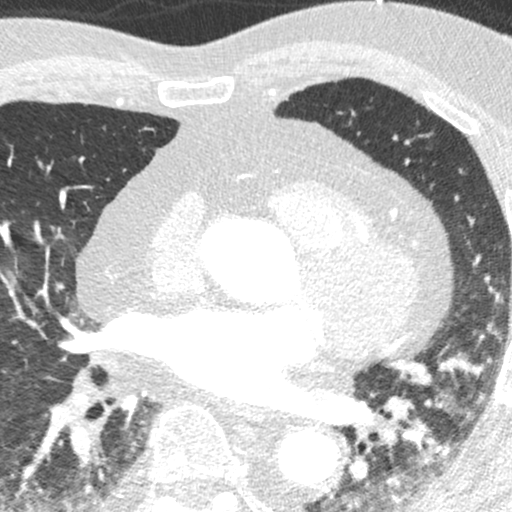

[8 of 20 positions shown; findings below may reference images not displayed]

FINDINGS: Vascular: Aortic atherosclerosis. No central pulmonary embolism, on
this non-dedicated study.

Mediastinum/Nodes: No imaged thoracic adenopathy.

Lungs/Pleura: No pleural fluid.  Bibasilar subsegmental atelectasis.

Upper Abdomen: Normal imaged portions of the liver, spleen.

Musculoskeletal: No acute osseous abnormality.
IMPRESSION: 1.  No acute findings in the imaged extracardiac chest.
2.  Aortic Atherosclerosis (E2684-9FC.C).
FINDINGS: Image quality: Good.

Noise artifact is: Limited.

Coronary calcium score is 0.

Coronary arteries: Normal coronary origins.  Right dominance.

Right Coronary Artery: Mild atherosclerotic plaque in the mid RCA,
25-49% stenosis. Moderate atherosclerotic plaque in the distal RCA,
50-69% stenosis.

Left Main Coronary Artery: Minimal distal left main atherosclerotic
plaque, <25% stenosis.

Left Anterior Descending Coronary Artery: Severe ostial and proximal
LAD atherosclerotic plaque, 70-99% stenosis. Mild mid LAD
atherosclerotic plaque, 25-49%.

Left Circumflex Artery: Mild proximal L circumflex atherosclerotic
plaque, 25-49% stenosis.

Aorta: Normal size, 37 mm at the mid ascending aorta (level of the
PA bifurcation) measured double oblique. No calcifications. No
dissection.

Aortic Valve: No calcifications.

Other findings:

Normal pulmonary vein drainage into the left atrium.

Normal left atrial appendage without thrombus. Probable tiny
accessory appendage superior and medial.

Mild dilation of main pulmonary artery, 30 mm.

Mild RV enlargement.
IMPRESSION: 1. Severe CAD in ostial and proximal LAD with noncalcified plaque,
CADRADS = 4. CT FFR will be performed and reported separately.

2. Coronary calcium score of 0.

3. Normal coronary origin with right dominance.

4. Mild dilation of main pulmonary artery, 30 mm. Mild right
ventricular enlargement.

*** End of Addendum ***
EXAM:
OVER-READ INTERPRETATION  CT CHEST

The following report is an over-read performed by radiologist Dr.
Ferienhaus Erxleben [REDACTED] on 08/17/2020. This over-read
does not include interpretation of cardiac or coronary anatomy or
pathology. The coronary CTA interpretation by the cardiologist is
attached.
FINDINGS: Vascular: Aortic atherosclerosis. No central pulmonary embolism, on
this non-dedicated study.

Mediastinum/Nodes: No imaged thoracic adenopathy.

Lungs/Pleura: No pleural fluid.  Bibasilar subsegmental atelectasis.

Upper Abdomen: Normal imaged portions of the liver, spleen.

Musculoskeletal: No acute osseous abnormality.
IMPRESSION: 1.  No acute findings in the imaged extracardiac chest.
2.  Aortic Atherosclerosis (E2684-9FC.C).

## 2022-02-09 ENCOUNTER — Telehealth: Payer: Self-pay

## 2022-02-09 DIAGNOSIS — Z79899 Other long term (current) drug therapy: Secondary | ICD-10-CM | POA: Diagnosis not present

## 2022-02-09 DIAGNOSIS — E669 Obesity, unspecified: Secondary | ICD-10-CM | POA: Diagnosis not present

## 2022-02-09 DIAGNOSIS — K219 Gastro-esophageal reflux disease without esophagitis: Secondary | ICD-10-CM | POA: Diagnosis not present

## 2022-02-09 DIAGNOSIS — J449 Chronic obstructive pulmonary disease, unspecified: Secondary | ICD-10-CM | POA: Diagnosis not present

## 2022-02-09 NOTE — Patient Outreach (Signed)
  Care Coordination   02/09/2022 Name: Neil Martin MRN: [id number] DOB: 01/29/1956   Care Coordination Outreach Attempts:  An unsuccessful telephone outreach was attempted today to offer the patient information about available care coordination services as a benefit of their health plan.   Follow Up Plan:  Additional outreach attempts will be made to offer the patient care coordination information and services.   Encounter Outcome:  No Answer  Care Coordination Interventions Activated:  No   Care Coordination Interventions:  No, not indicated    Juanell Fairly RN, BSN, [facility] Care Coordinator [facility]   Phone: [phone]

## 2022-03-14 DIAGNOSIS — J8283 Eosinophilic asthma: Secondary | ICD-10-CM | POA: Diagnosis not present

## 2022-04-11 DIAGNOSIS — J8283 Eosinophilic asthma: Secondary | ICD-10-CM | POA: Diagnosis not present

## 2022-04-11 DIAGNOSIS — Z79620 Long term (current) use of immunosuppressive biologic: Secondary | ICD-10-CM | POA: Diagnosis not present

## 2022-05-05 DIAGNOSIS — M545 Low back pain, unspecified: Secondary | ICD-10-CM | POA: Diagnosis not present

## 2022-05-05 DIAGNOSIS — M5416 Radiculopathy, lumbar region: Secondary | ICD-10-CM | POA: Diagnosis not present

## 2022-06-02 DIAGNOSIS — J449 Chronic obstructive pulmonary disease, unspecified: Secondary | ICD-10-CM | POA: Diagnosis not present
